# Patient Record
Sex: Female | Born: 1960 | Race: White | Hispanic: No | Marital: Single | Smoking: Former smoker
Health system: Southern US, Community
[De-identification: ages and names within clinical notes are randomized; demographics above are authoritative.]

## PROBLEM LIST (undated history)

## (undated) HISTORY — PX: APPENDECTOMY: SHX54

---

## 2013-02-04 ENCOUNTER — Emergency Department (INDEPENDENT_AMBULATORY_CARE_PROVIDER_SITE_OTHER): Payer: Medicaid Other

## 2013-02-04 ENCOUNTER — Emergency Department
Admission: EM | Admit: 2013-02-04 | Discharge: 2013-02-04 | Disposition: A | Discharge status: Home / Self Care | Payer: Medicaid Other | Source: Home / Self Care | Attending: Family Medicine | Admitting: Family Medicine

## 2013-02-04 ENCOUNTER — Encounter: Payer: Self-pay | Admitting: *Deleted

## 2013-02-04 DIAGNOSIS — S43421A Sprain of right rotator cuff capsule, initial encounter: Secondary | ICD-10-CM

## 2013-02-04 DIAGNOSIS — S43429A Sprain of unspecified rotator cuff capsule, initial encounter: Secondary | ICD-10-CM

## 2013-02-04 DIAGNOSIS — M25519 Pain in unspecified shoulder: Secondary | ICD-10-CM

## 2013-02-04 DIAGNOSIS — M25511 Pain in right shoulder: Secondary | ICD-10-CM

## 2013-02-04 DIAGNOSIS — Z20828 Contact with and (suspected) exposure to other viral communicable diseases: Secondary | ICD-10-CM

## 2013-02-04 MED ORDER — MELOXICAM 15 MG PO TABS: 15.0000 mg | ORAL_TABLET | Freq: Every day | ORAL | Status: DC

## 2013-02-04 MED ORDER — OSELTAMIVIR PHOSPHATE 75 MG PO CAPS: 75.0000 mg | ORAL_CAPSULE | Freq: Every day | ORAL | Status: DC

## 2013-02-04 NOTE — ED Provider Notes (Signed)
History     CSN: [id number]  Arrival date & time 02/04/13  1147   First MD Initiated Contact with Patient 02/04/13 1153      Chief Complaint  Patient presents with  . Shoulder Pain  . Influenza    HPI  Shoulder pain x 1 year.  Has been intermittent in nature.  Pt has been doing some heavy lifting including unloading mulch and raking leaves.  Has had moderate to severe anterolateral shoulder pain over the past 2 weeks.  No radicular sxs.  Neurovascularly intact distally.  Pain primarily with lifting.   Pt also reports + flu exposure in daughter who was diagnosed + flu B yesterday.  Would like prophylactic tamiflu Rx.   History reviewed. No pertinent past medical history.  Past Surgical History  Procedure Laterality Date  . Appendectomy      Family History  Problem Relation Age of Onset  . Heart failure Mother     History  Substance Use Topics  . Smoking status: Former Games developer  . Smokeless tobacco: Not on file  . Alcohol Use: Yes    OB History   Grav Para Term Preterm Abortions TAB SAB Ect Mult Living                  Review of Systems  All other systems reviewed and are negative.    Allergies  Amoxicillin  Home Medications  No current outpatient prescriptions on file.  BP 129/78  Pulse 73  Temp(Src) 98.5 F (36.9 C) (Oral)  Resp 16  Wt 161 lb (73.029 kg)  SpO2 100%  Physical Exam  Constitutional: She appears well-developed and well-nourished.  HENT:  Head: Normocephalic and atraumatic.  Eyes: Conjunctivae are normal. Pupils are equal, round, and reactive to light.  Neck: Normal range of motion.  Cardiovascular: Normal rate and regular rhythm.   Pulmonary/Chest: Effort normal.  Abdominal: Soft.  Musculoskeletal:       Arms: Empty can + on R    Neurological: She is alert.  Skin: Skin is warm.    ED Course  Procedures (including critical care time)  Labs Reviewed - No data to display Dg Shoulder Right  02/04/2013  *RADIOLOGY  REPORT*  Clinical Data: Right shoulder pain  RIGHT SHOULDER - 2+ VIEW  Comparison: None.  Findings: Normal alignment without fracture.  Mild AC joint degenerative change.  No significant arthropathy.  Visualized right ribs appear intact.  Right upper lobe clear.  Scoliosis of the spine evident.  IMPRESSION: No acute osseous finding.   Original Report Authenticated By: Judie Petit. Shick, M.D.      1. Pain in joint, shoulder region, right   2. Rotator cuff (capsule) sprain, right, initial encounter   3. Exposure to influenza       MDM  Xray negative for any acute degenerative process RICE and NSAIDs.  Rotator cuff exercises.  Follow up with sports medicine if sxs not improved.  Prophylactic rx for tamiflu given + flu exposure.    The patient and/or caregiver has been counseled thoroughly with regard to treatment plan and/or medications prescribed including dosage, schedule, interactions, rationale for use, and possible side effects and they verbalize understanding. Diagnoses and expected course of recovery discussed and will return if not improved as expected or if the condition worsens. Patient and/or caregiver verbalized understanding.             Doree Albee, MD 02/04/13 1302

## 2013-02-04 NOTE — ED Notes (Signed)
Pt c/o RT shoulder pain intermittently x 1 year, worse x 2wks. Denies injury. She is also here to get an rx for Tamiflu. She reports that her daughter was dx with flu B this morning.

## 2013-02-06 ENCOUNTER — Telehealth: Payer: Self-pay | Admitting: Emergency Medicine

## 2014-04-21 ENCOUNTER — Ambulatory Visit (INDEPENDENT_AMBULATORY_CARE_PROVIDER_SITE_OTHER): Payer: BC Managed Care – PPO | Admitting: Cardiology

## 2014-04-21 ENCOUNTER — Encounter: Payer: Self-pay | Admitting: Cardiology

## 2014-04-21 VITALS — BP 120/78 | HR 98 | Ht 66.0 in | Wt 160.0 lb

## 2014-04-21 DIAGNOSIS — F419 Anxiety disorder, unspecified: Secondary | ICD-10-CM | POA: Insufficient documentation

## 2014-04-21 DIAGNOSIS — R002 Palpitations: Secondary | ICD-10-CM | POA: Insufficient documentation

## 2014-04-21 DIAGNOSIS — F411 Generalized anxiety disorder: Secondary | ICD-10-CM

## 2014-04-21 MED ORDER — PROPRANOLOL HCL 20 MG PO TABS: ORAL_TABLET | ORAL | Status: DC

## 2014-04-21 NOTE — Patient Instructions (Signed)
Your physician recommends that you schedule a follow-up appointment as needed  

## 2014-04-21 NOTE — Progress Notes (Signed)
[address] [place], [place]  [address] Phone: [phone] Fax:  [phone]  Date:  04/21/2014   ID:  Vear Clock, DOB 03-22-1961, MRN [id number]  PCP:  [place], BETTY G, MD   History of Present Illness: Ashley Roman is a 53 y.o. female here for the evaluation of palpitations. She has previously been worked up by cardiology, echocardiogram was done and was negative. TSH was normal. EKG was normal. Propranolol was previously prescribed but occasionally she will hold. She has been dealing with quite a bit of anxiety.  15 years ago had frequent PVC's about to adopt daughter. Holter and ECHO then normal. PVC's were dx. Moved from [place] 2 years ago with daughter. Sister is here. Divorced. Increased frequency of PVC's noted with fluttering in her throat.   Dr. [place] had tried Dexilant, has eliminated fluttering in throat.  Sugar, wine, Sudafed exacerbates palpitation.  No high-risk symptoms such as syncope. No shortness of breath. Very rarely will she feel a sharp musculoskeletal type chest discomfort left upper chest.   Wt Readings from Last 3 Encounters:  04/21/14 160 lb (72.576 kg)  02/04/13 161 lb (73.029 kg)     No past medical history on file.  Past Surgical History  Procedure Laterality Date  . Appendectomy      Current Outpatient Prescriptions  Medication Sig Dispense Refill  . ALPRAZolam (XANAX) 0.25 MG tablet Take 0.5 mg by mouth at bedtime as needed for anxiety.      Marland Kitchen b complex vitamins tablet Take 1 tablet by mouth daily.      . Cholecalciferol (VITAMIN D) 2000 UNITS tablet Take 2,000 Units by mouth daily.      . fexofenadine-pseudoephedrine (ALLEGRA-D 24) 180-240 MG per 24 hr tablet Take 1 tablet by mouth as needed.      . magnesium 30 MG tablet Take 30 mg by mouth daily.      . Omega-3 Fatty Acids (FISH OIL) 1200 MG CAPS Take 1,200 mg by mouth.       No current facility-administered medications for this visit.    Allergies:    Allergies    Allergen Reactions  . Amoxicillin   . Bactrim [Sulfamethoxazole-Tmp Ds] Hives    Social History:  The patient  reports that she has quit smoking. She does not have any smokeless tobacco history on file. She reports that she drinks alcohol. She reports that she does not use illicit drugs.   Family History  Problem Relation Age of Onset  . Heart failure Mother    Mother - rheumatic HD, valve replaced. Father - PVC's.   ROS:  Please see the history of present illness.   Denies any syncope, fevers, chills, orthopnea, PND. Positive for anxiety, occasional GERD, insomnia   All other systems reviewed and negative.   PHYSICAL EXAM: VS:  BP 120/78  Pulse 98  Ht 5\' 6"  (1.676 m)  Wt 160 lb (72.576 kg)  BMI 25.84 kg/m2 Well nourished, well developed, in no acute distress HEENT: normal, [place]/AT, EOMI Neck: no JVD, normal carotid upstroke, no bruit Cardiac:  normal S1, S2; RRR; no murmur Lungs:  clear to auscultation bilaterally, no wheezing, rhonchi or rales Abd: soft, nontender, no hepatomegaly, no bruits Ext: no edema, 2+ distal pulses Skin: warm and dry GU: deferred Neuro: no focal abnormalities noted, AAO x 3  EKG:  12/21/13-sinus rhythm, no other abnormalities, normal QTC 410 ms. Labs: CBC, creatinine normal.  Prior medical records reviewed.  ASSESSMENT  AND PLAN:  1. Palpitations - previously diagnosed with PVCs. Currently not having any high-risk symptoms. Doing well. In fact, PPI has helped with fluttering sensation in her throat. She may take propranolol on a when necessary basis. Continue to limit use of excessive caffeine, Sudafed. Daily exercise will be important and I have encouraged her to help reduce her overall cardiovascular risk. Her HDL has been elevated. Excellent. Previous workup with echocardiogram is reassuring. She will let me know if any worrisome symptoms develop. No further cardiac testing at this time. 2. PRN  Signed, Donato SchultzMark Zach Tietje, MD [facility]  04/21/2014 11:07 AM

## 2014-04-21 NOTE — Addendum Note (Signed)
Addended by: Dennis BastLANIER, Jahaan Vanwagner F on: 04/21/2014 11:46 AM   Modules accepted: Orders

## 2014-06-03 ENCOUNTER — Telehealth: Payer: Self-pay | Admitting: Cardiology

## 2014-06-03 NOTE — Telephone Encounter (Signed)
Pt reports she has been having more palpitations lately due to increased stress.  She is apprehensive to use the propranolol RX she was given for them however because she doesn't know what it will do to her.  Reviewed possible side effects with her and advised she may want to take the medication for the first time while at home.  She reports she will try it after getting home from work.  Instructed her the most common side effects are generally fatigue and sometimes dizziness related to decreased BP and HR.  She states understanding and will call back if questions or concerns.

## 2014-06-03 NOTE — Telephone Encounter (Signed)
New Message  Pt has not taken her Beat Blocker.. She is not comfortable with taking it.. Requests a call back to discuss side effects and what to expect.. Please call.

## 2014-06-29 ENCOUNTER — Encounter: Payer: Self-pay | Admitting: Emergency Medicine

## 2014-06-29 ENCOUNTER — Emergency Department
Admission: EM | Admit: 2014-06-29 | Discharge: 2014-06-29 | Disposition: A | Discharge status: Home / Self Care | Payer: BC Managed Care – PPO | Source: Home / Self Care | Attending: Physician Assistant | Admitting: Physician Assistant

## 2014-06-29 DIAGNOSIS — R059 Cough, unspecified: Secondary | ICD-10-CM

## 2014-06-29 DIAGNOSIS — J069 Acute upper respiratory infection, unspecified: Secondary | ICD-10-CM

## 2014-06-29 DIAGNOSIS — R05 Cough: Secondary | ICD-10-CM

## 2014-06-29 MED ORDER — PREDNISONE 50 MG PO TABS: 50.0000 mg | ORAL_TABLET | Freq: Every day | ORAL | Status: DC

## 2014-06-29 MED ORDER — PROMETHAZINE-CODEINE 6.25-10 MG/5ML PO SYRP: 5.0000 mL | ORAL_SOLUTION | Freq: Three times a day (TID) | ORAL | Status: DC | PRN

## 2014-06-29 MED ORDER — DOXYCYCLINE HYCLATE 100 MG PO TABS
100.0000 mg | ORAL_TABLET | Freq: Two times a day (BID) | ORAL | Status: AC
Start: 2014-06-29 — End: 2014-07-06

## 2014-06-29 NOTE — ED Notes (Signed)
Pt c/o cough and chest hurts x 1 wk. Denies fever. She reports having a sinus infection 2 wks ago and taking a zpak.

## 2014-06-29 NOTE — Discharge Instructions (Signed)
Prednisone and doxycycline.  Vitamin C and zinc.  deslym for cough.  Phenergan/codeine given for cough

## 2014-06-29 NOTE — ED Provider Notes (Signed)
Agree with exam, assessment, and plan.   Lattie Haw, MD 06/29/14 [phone]

## 2014-06-29 NOTE — ED Provider Notes (Signed)
CSN: [id number]     Arrival date & time 06/29/14  1109 History   First MD Initiated Contact with Patient 06/29/14 1119     Chief Complaint  Patient presents with  . Cough   (Consider location/radiation/quality/duration/timing/severity/associated sxs/prior Treatment) HPI Pt presents to the clinic with 1 week of non-productive cough, chest tightness, sinus pressure and congestion. 2 weeks ago had sinus infection and treated with zpak. Denies any wheezing or SOB. Using albuterol inhaler as needed and somewhat beneficial. No fever, chills, n/v/d.   History reviewed. No pertinent past medical history. Past Surgical History  Procedure Laterality Date  . Appendectomy     Family History  Problem Relation Age of Onset  . Heart failure Mother   . Hypertension Mother    History  Substance Use Topics  . Smoking status: Former Games developer  . Smokeless tobacco: Not on file  . Alcohol Use: Yes   OB History   Grav Para Term Preterm Abortions TAB SAB Ect Mult Living                 Review of Systems  All other systems reviewed and are negative.   Allergies  Amoxicillin and Bactrim  Home Medications   Prior to Admission medications   Medication Sig Start Date End Date Taking? Authorizing Provider  ALPRAZolam Prudy Feeler) 0.25 MG tablet Take 0.5 mg by mouth at bedtime as needed for anxiety.    Historical Provider, MD  b complex vitamins tablet Take 1 tablet by mouth daily.    Historical Provider, MD  Cholecalciferol (VITAMIN D) 2000 UNITS tablet Take 2,000 Units by mouth daily.    Historical Provider, MD  doxycycline (VIBRA-TABS) 100 MG tablet Take 1 tablet (100 mg total) by mouth 2 (two) times daily. 06/29/14 07/06/14  Tinya Cadogan L Ailie Gage, PA-C  fexofenadine-pseudoephedrine (ALLEGRA-D 24) 180-240 MG per 24 hr tablet Take 1 tablet by mouth as needed.    Historical Provider, MD  magnesium 30 MG tablet Take 30 mg by mouth daily.    Historical Provider, MD  Omega-3 Fatty Acids (FISH OIL) 1200 MG CAPS Take  1,200 mg by mouth.    Historical Provider, MD  predniSONE (DELTASONE) 50 MG tablet Take 1 tablet (50 mg total) by mouth daily. 06/29/14   Tandi Hanko L Gerlad Pelzel, PA-C  promethazine-codeine (PHENERGAN WITH CODEINE) 6.25-10 MG/5ML syrup Take 5 mLs by mouth every 8 (eight) hours as needed for cough. 06/29/14   Hamid Brookens L Seraiah Nowack, PA-C  propranolol (INDERAL) 20 MG tablet Take twice daily as needed 04/21/14   Donato Schultz, MD   BP 125/80  Pulse 80  Temp(Src) 98.6 F (37 C) (Oral)  Resp 16  Ht 5' 5.5" (1.664 m)  Wt 161 lb (73.029 kg)  BMI 26.37 kg/m2  SpO2 100% Physical Exam  Constitutional: She is oriented to person, place, and time. She appears well-developed and well-nourished.  HENT:  Head: Normocephalic and atraumatic.  TM's erythematous no pus or blood.  Tenderness over frontal sinuses and around eyes.  Turbinates red and swollen bilaterally. Rhinorrhea present.   Eyes:  Injected conjunctiva bilaterally.   Neck: Normal range of motion. Neck supple.  Cardiovascular: Normal rate, regular rhythm and normal heart sounds.   Pulmonary/Chest: Effort normal and breath sounds normal. She has no wheezes.  Dry hacking cough  Lymphadenopathy:    She has no cervical adenopathy.  Neurological: She is alert and oriented to person, place, and time.  Skin: Skin is dry.  Psychiatric: She has a normal mood and affect. Her  behavior is normal.    ED Course  Procedures (including critical care time) Labs Review Labs Reviewed - No data to display  Imaging Review No results found.   MDM   1. Acute upper respiratory infections of unspecified site   2. Cough    Discussed could be that sinus infection did not completely resolve.  There could be a variant of post-viral cough.  Could be new viral exposure.  Treated with doxycycline for 10days.  Prednisone burst given.  flonase 2 sprays each nostril.  Continue vitamin C and zinc to help build immune system.  Discussed cough treatment. Asked for  phenergan/codiene cough syrup. Sedation warning given.  Gave HO with list of OTC symptomatic treatment.  Follow up if not improving.  Gave information on new pcp. Pt requested information.     Jomarie Longs, PA-C 06/29/14 1315

## 2014-07-01 ENCOUNTER — Telehealth: Payer: Self-pay | Admitting: *Deleted

## 2014-07-01 NOTE — Telephone Encounter (Signed)
Pt left vm stating that she's 3 days into the 5 of prednisone for URI.  She states that it seems to have kicked her PVCs up some and wants to know if it would be ok to stop now. Please advise.

## 2014-07-02 NOTE — Telephone Encounter (Signed)
She could half it or stop it.

## 2014-07-02 NOTE — Telephone Encounter (Signed)
LMOM notifying pt. 

## 2014-11-26 ENCOUNTER — Emergency Department
Admission: EM | Admit: 2014-11-26 | Discharge: 2014-11-26 | Disposition: A | Discharge status: Home / Self Care | Payer: Medicaid Other | Source: Home / Self Care | Attending: Family Medicine | Admitting: Family Medicine

## 2014-11-26 ENCOUNTER — Encounter: Payer: Self-pay | Admitting: Emergency Medicine

## 2014-11-26 DIAGNOSIS — J320 Chronic maxillary sinusitis: Secondary | ICD-10-CM

## 2014-11-26 DIAGNOSIS — H65192 Other acute nonsuppurative otitis media, left ear: Secondary | ICD-10-CM

## 2014-11-26 MED ORDER — CEFDINIR 300 MG PO CAPS: 300.0000 mg | ORAL_CAPSULE | Freq: Two times a day (BID) | ORAL | Status: DC

## 2014-11-26 NOTE — ED Notes (Signed)
Sinus pain, pressure, headache, drainage, left ear pain, x 3 weeks

## 2014-11-26 NOTE — ED Provider Notes (Addendum)
CSN: [id number]     Arrival date & time 11/26/14  1249 History   First MD Initiated Contact with Patient 11/26/14 1313     Chief Complaint  Patient presents with  . Sinus Problem     HPI Comments: Patient reports that she had a typical cold about 3 weeks ago that seemed to improve.  Over the past 1.5 weeks she has developed increased sinus congestion with left earache that has persisted.  She has been fatigued, and has had chills.  She notes that her left upper teeth have ached.  The history is provided by the patient.    History reviewed. No pertinent past medical history. Past Surgical History  Procedure Laterality Date  . Appendectomy     Family History  Problem Relation Age of Onset  . Heart failure Mother   . Hypertension Mother    History  Substance Use Topics  . Smoking status: Former Games developer  . Smokeless tobacco: Not on file  . Alcohol Use: Yes   OB History    No data available     Review of Systems No sore throat + cough No pleuritic pain No wheezing + nasal congestion + post-nasal drainage + sinus pain/pressure No itchy/red eyes + left earache No hemoptysis No SOB No fever, + chills No nausea No vomiting No abdominal pain No diarrhea No urinary symptoms No skin rash + fatigue No myalgias + headache Used OTC meds without relief  Allergies  Amoxicillin and Bactrim  Home Medications   Prior to Admission medications   Medication Sig Start Date End Date Taking? Authorizing Provider  ALPRAZolam Prudy Feeler) 0.25 MG tablet Take 0.5 mg by mouth at bedtime as needed for anxiety.    Historical Provider, MD  b complex vitamins tablet Take 1 tablet by mouth daily.    Historical Provider, MD  cefdinir (OMNICEF) 300 MG capsule Take 1 capsule (300 mg total) by mouth 2 (two) times daily. 11/26/14   Lattie Haw, MD  Cholecalciferol (VITAMIN D) 2000 UNITS tablet Take 2,000 Units by mouth daily.    Historical Provider, MD  fexofenadine-pseudoephedrine (ALLEGRA-D  24) 180-240 MG per 24 hr tablet Take 1 tablet by mouth as needed.    Historical Provider, MD  magnesium 30 MG tablet Take 30 mg by mouth daily.    Historical Provider, MD  Omega-3 Fatty Acids (FISH OIL) 1200 MG CAPS Take 1,200 mg by mouth.    Historical Provider, MD  propranolol (INDERAL) 20 MG tablet Take twice daily as needed 04/21/14   Donato Schultz, MD   BP 129/79 mmHg  Pulse 72  Temp(Src) 98.4 F (36.9 C) (Oral)  Ht  (1.676 m)  Wt 171 lb (77.565 kg)  BMI 27.61 kg/m2  SpO2 100% Physical Exam Nursing notes and Vital Signs reviewed. Appearance:  Patient appears stated age, and in no acute distress Eyes:  Pupils are equal, round, and reactive to light and accomodation.  Extraocular movement is intact.  Conjunctivae are not inflamed  Ears:  Canals normal.  Tympanic membranes normal.  Nose:   Congested turbinates.  There is tenderness over maxillary sinuses.  Mouth:  Normal.  Pharynx:  Normal Neck:  Supple.   Tender shotty right posterior node. Lungs:  Clear to auscultation.  Breath sounds are equal.  Heart:  Regular rate and rhythm without murmurs, rubs, or gallops.  Abdomen:  Nontender without masses or hepatosplenomegaly.  Bowel sounds are present.  No CVA or flank tenderness.  Extremities:  No edema.  No  calf tenderness Skin:  No rash present.   ED Course  Procedures  None   Labs Reviewed -  Tympanogram:  Right ear normal; left ear low peak height ("flat line")       MDM   1. Acute nonsuppurative otitis media of left ear   2. Maxillary sinusitis, unspecified chronicity    Begin Omnicef 300mg  BID (she notes that she has taken Keflex without adverse effect).  Patient declines Rx for prednisone Take plain guaifenesin 1200mg  (Mucinex) twice daily, with plenty of water, for cough and congestion.  May add Pseudoephedrine for sinus congestion.  Get adequate rest.   May use Afrin nasal spray (or generic oxymetazoline) twice daily for about 5 days.  Also recommend using saline  nasal spray several times daily and saline nasal irrigation (AYR is a common brand).  Use Nasonex nasal spray in the morning after Afrin spray and saline irrigation.  Stop all antihistamines for now, and other non-prescription cough/cold preparations. Follow-up with family doctor or ENT if not improving about one week.    Lattie HawStephen A Beese, MD 11/29/14 1355   Addendum: Patient requests Rx for Prednisone. Begin prednisone burst.  Lattie HawStephen A Beese, MD 11/29/14 1357

## 2014-11-26 NOTE — Discharge Instructions (Signed)
Take plain guaifenesin 1200mg  (Mucinex) twice daily, with plenty of water, for cough and congestion.  May add Pseudoephedrine for sinus congestion.  Get adequate rest.   May use Afrin nasal spray (or generic oxymetazoline) twice daily for about 5 days.  Also recommend using saline nasal spray several times daily and saline nasal irrigation (AYR is a common brand).  Use Nasonex nasal spray in the morning after Afrin spray and saline irrigation.  Stop all antihistamines for now, and other non-prescription cough/cold preparations. Follow-up with family doctor or ENT if not improving about one week.

## 2014-11-29 ENCOUNTER — Telehealth: Payer: Self-pay | Admitting: Emergency Medicine

## 2014-11-29 MED ORDER — PREDNISONE 20 MG PO TABS: 20.0000 mg | ORAL_TABLET | Freq: Two times a day (BID) | ORAL | Status: DC

## 2014-12-06 ENCOUNTER — Telehealth: Payer: Self-pay | Admitting: *Deleted

## 2014-12-06 NOTE — ED Notes (Unsigned)
pt called c/o still having some fluid feeling in her ears. she finished ABT, stopped Prednisone after 3 days due to palpatations, and is now taking decongestant which seems to help some. wonders if she should take anything else. advised to try decongestant for a few days if still no improvement then return to clinic for a recheck. Pt agrees. Clemens Catholichristy Najeeb Uptain, LPN

## 2014-12-08 ENCOUNTER — Encounter: Payer: Self-pay | Admitting: Emergency Medicine

## 2014-12-08 ENCOUNTER — Emergency Department
Admission: EM | Admit: 2014-12-08 | Discharge: 2014-12-08 | Disposition: A | Discharge status: Home / Self Care | Payer: Medicaid Other | Source: Home / Self Care | Attending: Family Medicine | Admitting: Family Medicine

## 2014-12-08 DIAGNOSIS — H6982 Other specified disorders of Eustachian tube, left ear: Secondary | ICD-10-CM

## 2014-12-08 NOTE — ED Provider Notes (Signed)
CSN: [id number]     Arrival date & time 12/08/14  [id number] History   First MD Initiated Contact with Patient 12/08/14 1038     Chief Complaint  Patient presents with  . Otalgia      HPI Comments: Patient complains of persistent sensation of fullness in her left ear after treatment.  The history is provided by the patient.    History reviewed. No pertinent past medical history. Past Surgical History  Procedure Laterality Date  . Appendectomy     Family History  Problem Relation Age of Onset  . Heart failure Mother   . Hypertension Mother    History  Substance Use Topics  . Smoking status: Former Games developermoker  . Smokeless tobacco: Not on file  . Alcohol Use: Yes   OB History    No data available     Review of Systems No sore throat No cough No pleuritic pain No wheezing Minimal nasal congestion No post-nasal drainage No sinus pain/pressure No itchy/red eyes No earache, but left ear feels full No hemoptysis No SOB No fever/chills No nausea No vomiting No abdominal pain No diarrhea No urinary symptoms No skin rash No fatigue No myalgias No headache Used OTC meds without relief  Allergies  Amoxicillin and Bactrim  Home Medications   Prior to Admission medications   Medication Sig Start Date End Date Taking? Authorizing Provider  ALPRAZolam Prudy Feeler(XANAX) 0.25 MG tablet Take 0.5 mg by mouth at bedtime as needed for anxiety.    Historical Provider, MD  b complex vitamins tablet Take 1 tablet by mouth daily.    Historical Provider, MD  cefdinir (OMNICEF) 300 MG capsule Take 1 capsule (300 mg total) by mouth 2 (two) times daily. 11/26/14   Lattie HawStephen A Beese, MD  Cholecalciferol (VITAMIN D) 2000 UNITS tablet Take 2,000 Units by mouth daily.    Historical Provider, MD  fexofenadine-pseudoephedrine (ALLEGRA-D 24) 180-240 MG per 24 hr tablet Take 1 tablet by mouth as needed.    Historical Provider, MD  magnesium 30 MG tablet Take 30 mg by mouth daily.    Historical Provider, MD   Omega-3 Fatty Acids (FISH OIL) 1200 MG CAPS Take 1,200 mg by mouth.    Historical Provider, MD  predniSONE (DELTASONE) 20 MG tablet Take 1 tablet (20 mg total) by mouth 2 (two) times daily. Take with food. 11/29/14   Lattie HawStephen A Beese, MD  propranolol (INDERAL) 20 MG tablet Take twice daily as needed 04/21/14   Donato SchultzMark Skains, MD   BP 136/85 mmHg  Pulse 81  Temp(Src) 98.1 F (36.7 C) (Oral)  SpO2 100% Physical Exam Nursing notes and Vital Signs reviewed. Appearance:  Patient appears stated age, and in no acute distress Eyes:  Pupils are equal, round, and reactive to light and accomodation.  Extraocular movement is intact.  Conjunctivae are not inflamed  Ears:  Canals normal.  Tympanic membranes normal.  Nose:  Mildly congested turbinates.  No sinus tenderness.  Pharynx:  Normal Neck:  Supple.  No adenopathy    ED Course  Procedures  Labs Reviewed -  Tympanogram:  Low peak height ("flat line") left ear; Normal right ear       MDM   1. Dysfunction of left Eustachian tube; minimal improvement from previous visit     Continue Mucinex, Nasonex spray, and antihistamine. Followup with ENT if symptoms persist 2 to 3 weeks.    Lattie HawStephen A Beese, MD 12/10/14 [phone]

## 2014-12-08 NOTE — Discharge Instructions (Signed)
Continue Mucinex, Nasonex spray, and antihistamine.

## 2014-12-08 NOTE — ED Notes (Signed)
Pt c/o left ear pain that has been persistent for about 1 month. She describes it as a fullness.

## 2014-12-10 ENCOUNTER — Telehealth: Payer: Self-pay | Admitting: *Deleted

## 2015-01-21 ENCOUNTER — Telehealth: Payer: Self-pay | Admitting: Cardiology

## 2015-01-21 NOTE — Telephone Encounter (Signed)
New message       PCP want to presc methoprednesione. After 2 courses of antibiotics for sinus inf, she developed fluid in inner ear.   When taken prednisone before it would increase her PVC's.. Is it ok to try taking this again?

## 2015-01-21 NOTE — Telephone Encounter (Signed)
Will review with MD and call pt back.

## 2015-01-21 NOTE — Telephone Encounter (Signed)
Pt aware ok to take  ?

## 2015-01-21 NOTE — Telephone Encounter (Signed)
Ok with me SKAINS, MARK, MD  

## 2015-05-05 ENCOUNTER — Encounter: Payer: Self-pay | Admitting: *Deleted

## 2015-05-05 ENCOUNTER — Emergency Department
Admission: EM | Admit: 2015-05-05 | Discharge: 2015-05-05 | Disposition: A | Discharge status: Home / Self Care | Payer: Medicaid Other | Source: Home / Self Care | Attending: Emergency Medicine | Admitting: Emergency Medicine

## 2015-05-05 DIAGNOSIS — H6982 Other specified disorders of Eustachian tube, left ear: Secondary | ICD-10-CM

## 2015-05-05 DIAGNOSIS — J0101 Acute recurrent maxillary sinusitis: Secondary | ICD-10-CM

## 2015-05-05 MED ORDER — TRIAMCINOLONE ACETONIDE 55 MCG/ACT NA AERO: 2.0000 | INHALATION_SPRAY | Freq: Every day | NASAL | Status: AC

## 2015-05-05 MED ORDER — CEFDINIR 300 MG PO CAPS
300.0000 mg | ORAL_CAPSULE | Freq: Two times a day (BID) | ORAL | Status: AC
Start: 2015-05-05 — End: ?

## 2015-05-05 MED ORDER — METHYLPREDNISOLONE 4 MG PO TBPK
ORAL_TABLET | ORAL | Status: AC
Start: 2015-05-05 — End: ?

## 2015-05-05 NOTE — ED Notes (Signed)
Pt c/o 2 weeks of left ear and facial pain with congestion.  Afebrile.

## 2015-05-05 NOTE — ED Provider Notes (Signed)
CSN: [id number]     Arrival date & time 05/05/15  1305 History   First MD Initiated Contact with Patient 05/05/15 1305     Chief Complaint  Patient presents with  . Otalgia  . Sinus Problem     2 weeks of left ear and facial pain with congestion  HPI   Onset: 14 days  Left Facial/sinus and left ear pressure with slight discolored nasal mucus.    Severity: moderate, progressively worsening Tried OTC meds and Flonase without significant relief.  Symptoms:  + Low-grade Fever  + Low-grade URI prodrome with nasal congestion + Minimal swollen neck glands + mild left Sinus Headache + mild ear pressure, similar to prior left eustachian tube dysfunction symptoms which were diagnosed by ENT in the past. In the past, Flonase may have helped, but doesn't seem to be working this time. In the past, prednisone may have caused fast heartbeat, but Medrol dosepak or 12 without side effects. She is avoiding decongestants which may have caused fast heartbeat. Denies tinnitus. No vertigo or other focal neurologic symptoms.  Minimal Allergy symptoms. Symptoms also exacerbated by hot weather, 98, humid conditions, and she cleaned out a closet recently and exposure to dust may have triggered this. Left facial pain 6 out of 10 intensity. Denies history of migraines or cluster headaches. No significant Sore Throat No eye symptoms     No significant Cough No chest pain No shortness of breath  No wheezing  No Abdominal Pain No Nausea No Vomiting No diarrhea  No Myalgias No focal neurologic symptoms No syncope No Rash  No Urinary symptoms  Remainder of Review of Systems negative for acute change except as noted in the HPI.  History reviewed. No pertinent past medical history. Past Surgical History  Procedure Laterality Date  . Appendectomy     Family History  Problem Relation Age of Onset  . Heart failure Mother   . Hypertension Mother    History  Substance Use Topics  . Smoking  status: Former Games developer  . Smokeless tobacco: Not on file  . Alcohol Use: Yes   OB History    No data available     Review of Systems Remainder of Review of Systems negative for acute change except as noted in the HPI.  Allergies  Amoxicillin and Bactrim In the past, cefdinir worked well without side effects Home Medications   Prior to Admission medications   Medication Sig Start Date End Date Taking? Authorizing Provider  ALPRAZolam Prudy Feeler) 0.25 MG tablet Take 0.5 mg by mouth at bedtime as needed for anxiety.    Historical Provider, MD  b complex vitamins tablet Take 1 tablet by mouth daily.    Historical Provider, MD  cefdinir (OMNICEF) 300 MG capsule Take 1 capsule (300 mg total) by mouth 2 (two) times daily. X 10 days 05/05/15   Lajean Manes, MD  Cholecalciferol (VITAMIN D) 2000 UNITS tablet Take 2,000 Units by mouth daily.    Historical Provider, MD  fexofenadine-pseudoephedrine (ALLEGRA-D 24) 180-240 MG per 24 hr tablet Take 1 tablet by mouth as needed.    Historical Provider, MD  magnesium 30 MG tablet Take 30 mg by mouth daily.    Historical Provider, MD  methylPREDNISolone (MEDROL DOSEPAK) 4 MG TBPK tablet Take as directed for 6 days 05/05/15   Lajean Manes, MD  Omega-3 Fatty Acids (FISH OIL) 1200 MG CAPS Take 1,200 mg by mouth.    Historical Provider, MD  propranolol (INDERAL) 20 MG tablet Take twice daily  as needed 04/21/14   Jake Bathe, MD  triamcinolone (NASACORT ALLERGY 24HR) 55 MCG/ACT AERO nasal inhaler Place 2 sprays into the nose daily. 05/05/15   Lajean Manes, MD   BP 127/86 mmHg  Pulse 79  Temp(Src) 98.8 F (37.1 C) (Oral)  Resp 14  Wt 168 lb (76.204 kg)  SpO2 99% Physical Exam  Constitutional: She is oriented to person, place, and time. She appears well-developed and well-nourished. No distress.  HENT:  Head: Normocephalic and atraumatic.  Right Ear: Tympanic membrane, external ear and ear canal normal.  Left Ear: External ear and ear canal normal.  Nose:  Mucosal edema and rhinorrhea present. Right sinus exhibits no maxillary sinus tenderness. Left sinus exhibits maxillary sinus tenderness.  Mouth/Throat: Oropharynx is clear and moist. No oral lesions. No oropharyngeal exudate.  Left TM without erythema but mild air fluid level.  Eyes: Right eye exhibits no discharge. Left eye exhibits no discharge. No scleral icterus.  Neck: Neck supple.  Cardiovascular: Normal rate, regular rhythm and normal heart sounds.   Pulmonary/Chest: Effort normal and breath sounds normal. She has no wheezes. She has no rales.  Lymphadenopathy:    She has no cervical adenopathy.  Neurological: She is alert and oriented to person, place, and time.  Skin: Skin is warm and dry.  Nursing note and vitals reviewed.   ED Course  Procedures (including critical care time) Labs Review Labs Reviewed - No data to display  Imaging Review No results found.   MDM   1. Acute recurrent maxillary sinusitis   2. Eustachian tube dysfunction, left    Treatment options discussed, as well as risks, benefits, alternatives. Patient voiced understanding and agreement with the following plans: New Prescriptions   CEFDINIR (OMNICEF) 300 MG CAPSULE    Take 1 capsule (300 mg total) by mouth 2 (two) times daily. X 10 days   METHYLPREDNISOLONE (MEDROL DOSEPAK) 4 MG TBPK TABLET    Take as directed for 6 days   TRIAMCINOLONE (NASACORT ALLERGY 24HR) 55 MCG/ACT AERO NASAL INHALER    Place 2 sprays into the nose daily.   Follow-up with your primary care doctor or ENT in 5-7 days if not improving, or sooner if symptoms become worse. Precautions discussed. Red flags discussed. Questions invited and answered. Patient voiced understanding and agreement.     Lajean Manes, MD 05/05/15 1349

## 2015-05-20 ENCOUNTER — Other Ambulatory Visit: Payer: Self-pay | Admitting: Cardiology

## 2016-04-25 ENCOUNTER — Emergency Department
Admission: EM | Admit: 2016-04-25 | Discharge: 2016-04-25 | Disposition: A | Discharge status: Home / Self Care | Payer: Medicaid Other | Source: Home / Self Care | Attending: Family Medicine | Admitting: Family Medicine

## 2016-04-25 ENCOUNTER — Encounter: Payer: Self-pay | Admitting: Emergency Medicine

## 2016-04-25 NOTE — ED Notes (Signed)
Reports sinus pain, headache and congestion for 'couple weeks'; took ibuprofen 400mg  po.

## 2017-06-25 ENCOUNTER — Ambulatory Visit (HOSPITAL_COMMUNITY): Payer: Self-pay | Admitting: Licensed Clinical Social Worker

## 2020-10-31 ENCOUNTER — Other Ambulatory Visit: Payer: Self-pay

## 2020-10-31 ENCOUNTER — Other Ambulatory Visit: Payer: 59

## 2020-10-31 DIAGNOSIS — Z20822 Contact with and (suspected) exposure to covid-19: Secondary | ICD-10-CM

## 2020-11-01 LAB — SARS-COV-2, NAA 2 DAY TAT

## 2020-11-01 LAB — NOVEL CORONAVIRUS, NAA: SARS-CoV-2, NAA: NOT DETECTED
